# Patient Record
Sex: Female | Born: 1945 | Race: Black or African American | Marital: Single | Smoking: Never smoker
Health system: Southern US, Community
[De-identification: ages and names within clinical notes are randomized; demographics above are authoritative.]

---

## 2013-05-22 ENCOUNTER — Other Ambulatory Visit: Payer: Self-pay | Admitting: Orthopaedic Surgery

## 2013-05-22 ENCOUNTER — Ambulatory Visit
Admission: RE | Admit: 2013-05-22 | Discharge: 2013-05-22 | Disposition: A | Discharge status: Home / Self Care | Payer: Medicare Other | Source: Ambulatory Visit | Attending: Orthopaedic Surgery | Admitting: Orthopaedic Surgery

## 2013-05-22 DIAGNOSIS — M79669 Pain in unspecified lower leg: Secondary | ICD-10-CM

## 2013-05-22 DIAGNOSIS — M79662 Pain in left lower leg: Secondary | ICD-10-CM

## 2013-05-22 DIAGNOSIS — M7989 Other specified soft tissue disorders: Secondary | ICD-10-CM

## 2014-05-20 IMAGING — US US EXTREM LOW VENOUS*L*
1 series · 14 of 24 positions shown · non-contrast
Comparison: None.

CLINICAL DATA: 66-year-old female with left lower extremity pain
and swelling.

LEFT LOWER EXTREMITY VENOUS DUPLEX ULTRASOUND
TECHNIQUE: Gray-scale sonography with graded compression, as well
as color Doppler and duplex ultrasound were performed to evaluate
the deep venous system of the lower extremity from the level of the
common femoral vein through the popliteal and proximal calf veins.
Spectral Doppler was utilized to evaluate flow at rest and with
distal augmentation maneuvers.

[Series 1: us extrem low venous*left* · 14 of 38 slices shown]
[im 1/38]
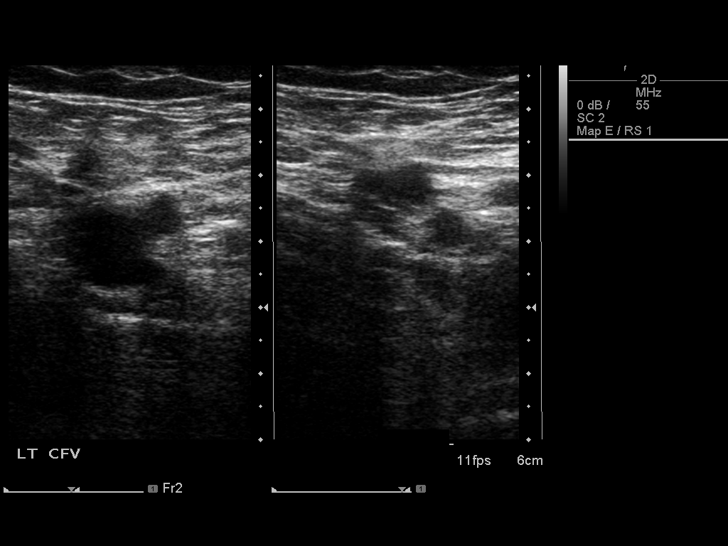
[im 4/38]
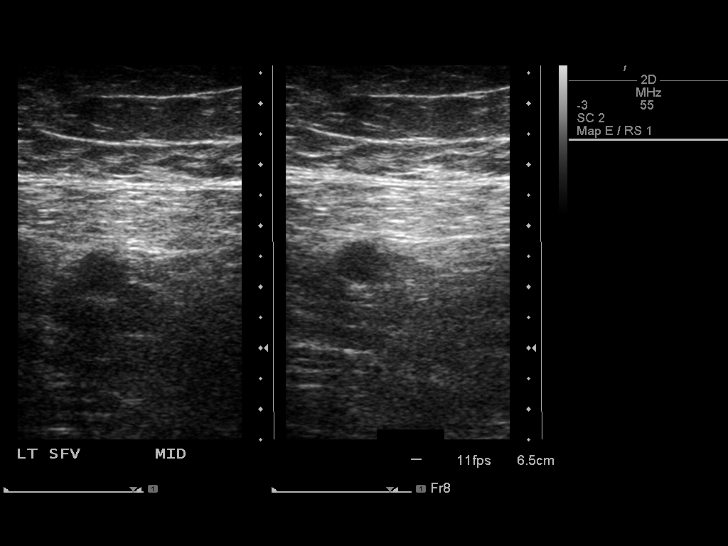
[im 7/38]
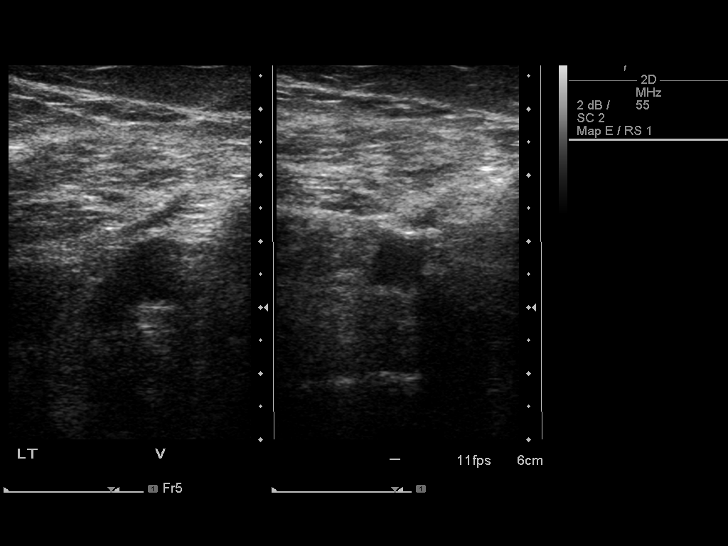
[im 10/38]
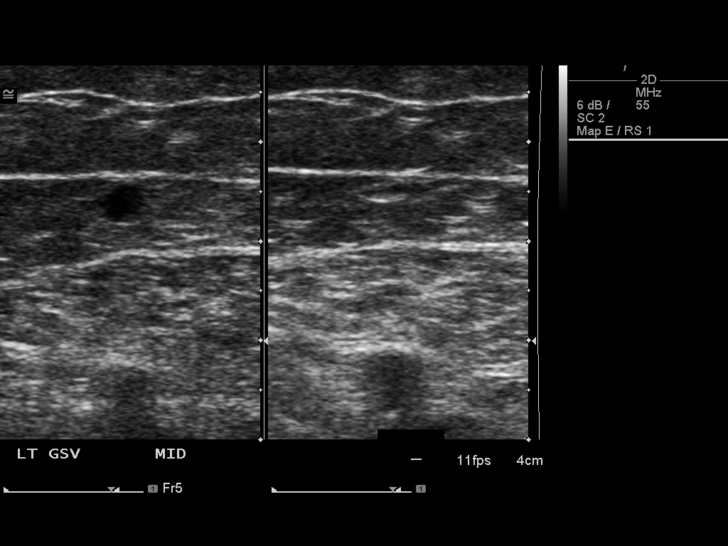
[im 12/38]
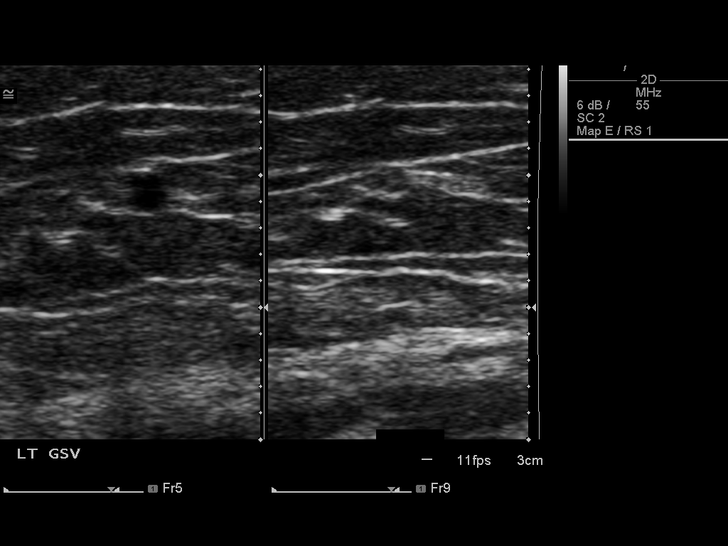
[im 15/38]
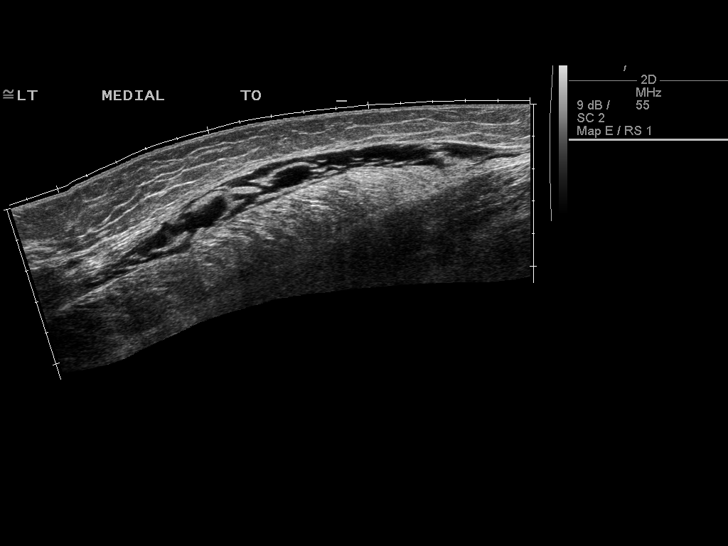
[im 18/38]
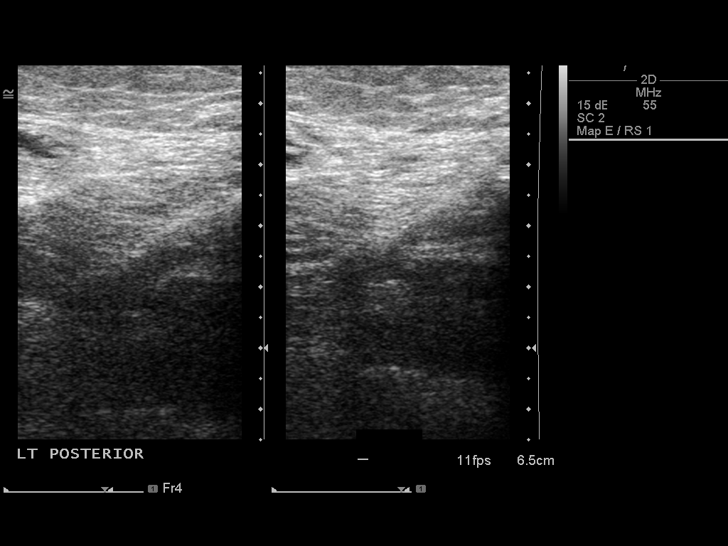
[im 20/38]
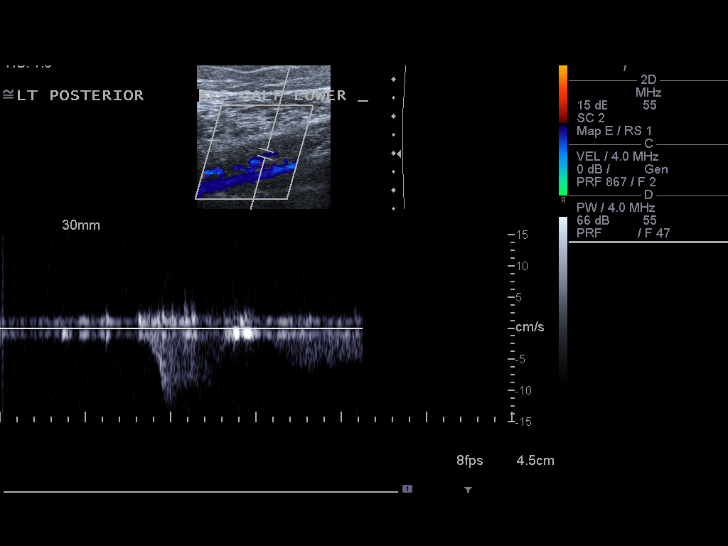
[im 23/38]
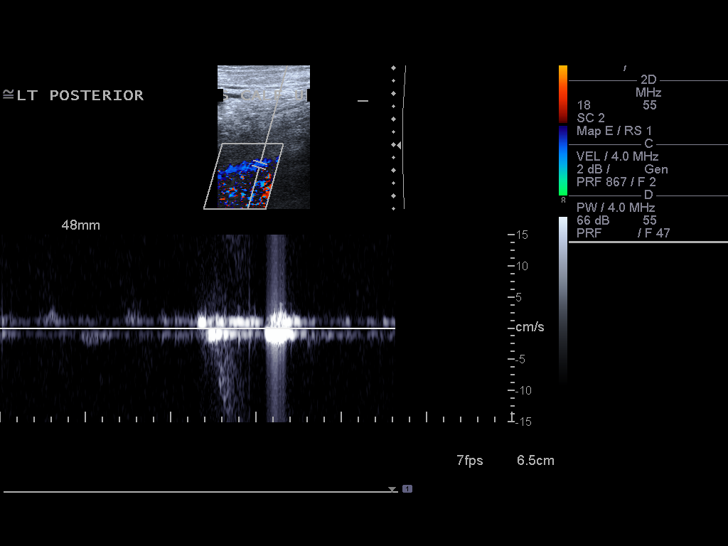
[im 26/38]
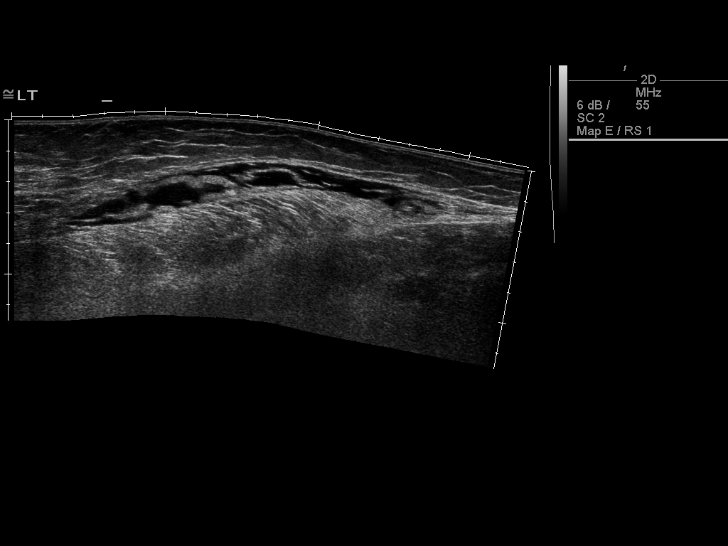
[im 29/38]
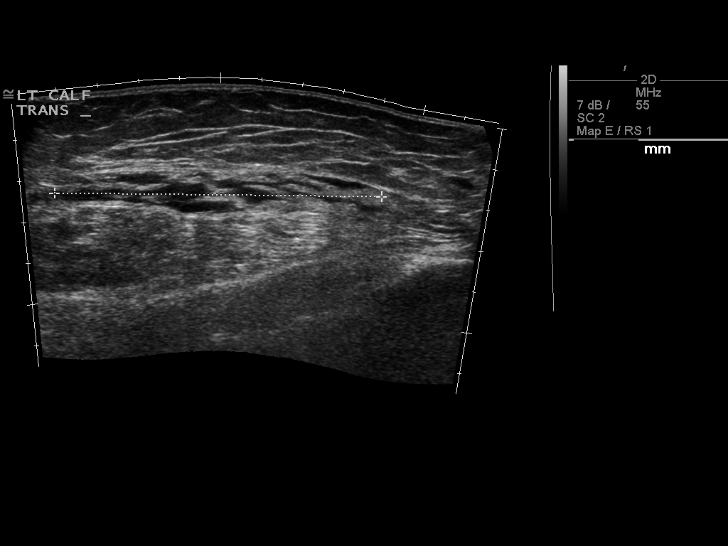
[im 31/38]
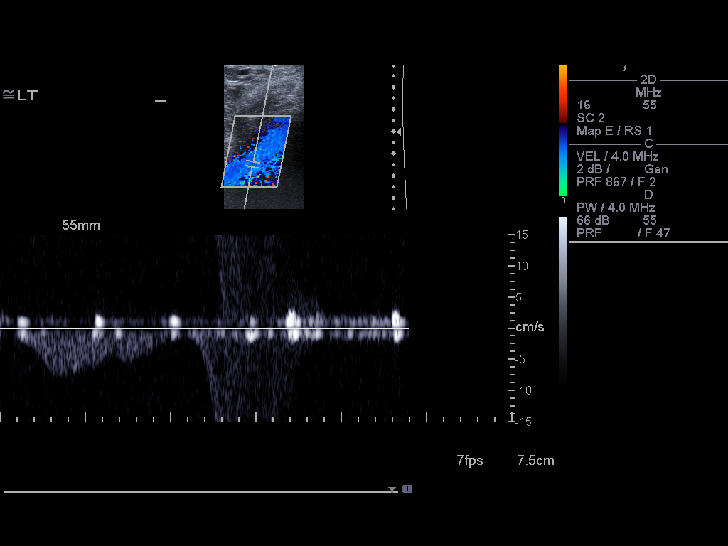
[im 34/38]
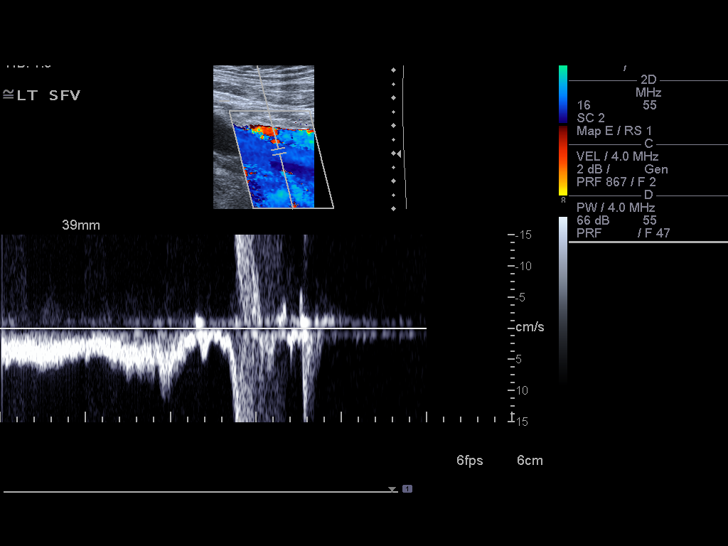
[im 38/38]
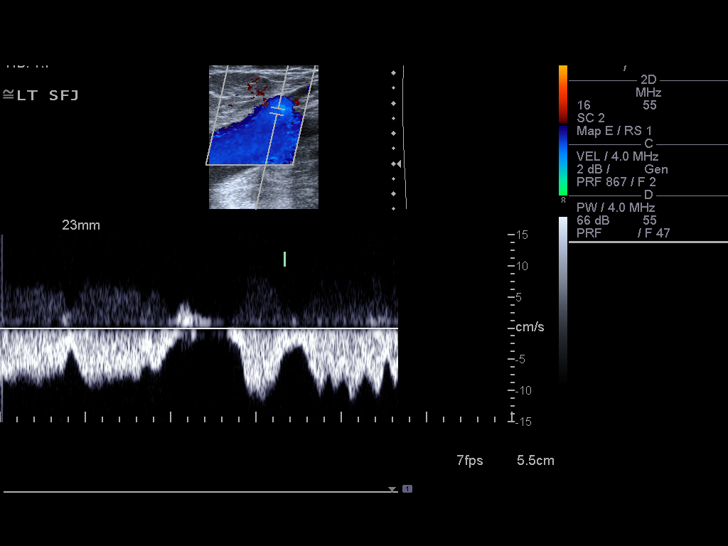

[14 of 24 positions shown; findings below may reference images not displayed]

FINDINGS: Normal compressibility of the common femoral,
superficial femoral, and popliteal veins is demonstrated, as well
as the visualized proximal calf veins.  No filling defects to
suggest DVT on grayscale or color Doppler imaging.  Doppler
waveforms show normal direction of venous flow, normal respiratory
phasicity and response to augmentation.

There is edema superficial to the calf musculature.  No discrete
collection or mass is identified.
IMPRESSION: No evidence of left lower extremity deep vein thrombosis.

Left calf edema.

## 2017-06-21 ENCOUNTER — Ambulatory Visit (INDEPENDENT_AMBULATORY_CARE_PROVIDER_SITE_OTHER): Payer: Medicare Other

## 2017-06-21 ENCOUNTER — Encounter (INDEPENDENT_AMBULATORY_CARE_PROVIDER_SITE_OTHER): Payer: Self-pay | Admitting: Orthopaedic Surgery

## 2017-06-21 ENCOUNTER — Ambulatory Visit (INDEPENDENT_AMBULATORY_CARE_PROVIDER_SITE_OTHER): Payer: Medicare Other | Admitting: Orthopaedic Surgery

## 2017-06-21 VITALS — BP 137/77 | HR 90 | Resp 14 | Ht 70.0 in | Wt 200.0 lb

## 2017-06-21 DIAGNOSIS — M5440 Lumbago with sciatica, unspecified side: Secondary | ICD-10-CM

## 2017-06-21 NOTE — Progress Notes (Signed)
Office Visit Note   Patient: Natalie Mahoney           Date of Birth: [birth year]           MRN: [id number] Visit Date: 06/21/2017              Requested by: No referring provider defined for this encounter. PCP: Patient, No Pcp Per   Assessment & Plan: Visit Diagnoses:  1. Low back pain with sciatica, sciatica laterality unspecified, unspecified back pain laterality, unspecified chronicity     Plan: Long discussion over a least 45 minutes regarding possible etiology of her pain. I believe discomfort originates in the lumbar spine. She has evidence of claudication and possibly spinal stenosis. 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 [place] She would like to try a course of physical therapy. Given her prescription for the above. If she doesn't have much improvement over the next 4-6 weeks I think it's worth obtaining an MRI scan of her lumbar spine  Follow-Up Instructions: Return if symptoms worsen or fail to improve.   Orders:  Orders Placed This Encounter  Procedures  . XR Lumbar Spine 2-3 Views  . XR Pelvis 1-2 Views   No orders of the defined types were placed in this encounter.     Procedures: No procedures performed   Clinical Data: No additional findings.   Subjective: Chief Complaint  Patient presents with  . Left Shoulder - Pain  Natalie Mahoney Presents today with complaints of low back and right lower extremity pain. There is no history of injury or trauma. She denies shortness of breath or chest pain. Pain was insidious in onset gaining several months ago. It seems to start in her lumbar spine and radiates to her right buttock and then into her right leg both thigh and calf. Symptoms are predominantly pain without numbness or tingling. She's really not having much groin discomfort. No symptoms on the left. She also was not experiencing any abdominal complaints. She  is able to sleep comfortably. The longer she stands or the further she walks she'll have some discomfort  HPI  Review of Systems  Constitutional: Negative for chills, fatigue and fever.  Eyes: Negative for itching.  Respiratory: Negative for chest tightness and shortness of breath.   Cardiovascular: Negative for chest pain, palpitations and leg swelling.  Gastrointestinal: Negative for blood in stool, constipation and diarrhea.  Endocrine: Negative for polyuria.  Genitourinary: Negative for dysuria.  Musculoskeletal: Negative for back pain, joint swelling, neck pain and neck stiffness.  Allergic/Immunologic: Negative for immunocompromised state.  Neurological: Negative for dizziness and numbness.  Hematological: Does not bruise/bleed easily.  Psychiatric/Behavioral: The patient is not nervous/anxious.      Objective: Vital Signs: BP 137/77   Pulse 90   Resp 14   Ht 5\' 10"  (1.778 m)   Wt 200 lb (90.7 kg)   BMI 28.70 kg/m   Physical Exam  Ortho Exam awake alert and oriented 3. Comfortable sitting. Nicely dressed. Straight leg raise is negative bilaterally. No obvious hypertrophy or atrophy of one leg compared to the other. No swelling distally. There are some venous stasis changes in both legs the distal third. The pulses. No knee pain. Some mild discomfort with internal/external rotation of her right hip compared to left. This is consistent with  mild arthritis on the plain films. Very minimal percussible tenderness of the lumbar spine. No  pain over the sacroiliac joints  Specialty Comments:  No specialty comments available.  Imaging: Xr Lumbar Spine 2-3 Views  Result Date: 06/21/2017 Films lumbar spine obtained in 2 projections. 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 Colonic stool obliterates much of the detail. There are facet joint changes at L34 L4-5 and L5-S1 there is an anterior listhesis grade 1 of L4 on L5. Decreased disc space height at L5-S1  without listhesis. Facet joint changes and also identified at L3-4 L4-5 and L5-S1. Mild calcification abdominal aorta without obvious aneurysmal dilatation  Xr Pelvis 1-2 Views  Result Date: 06/21/2017 AP of the pelvis demonstrates some mild arthritis of the right hip compared to left. There are some subchondral cysts in the femoral head with sclerosis. Minimal changes on the left. Bony pelvis appears to be intact    PMFS History: There are no active problems to display for this patient.  History reviewed. No pertinent past medical history.  No family history on file.  History reviewed. No pertinent surgical history. Social History   Occupational History  . Not on file.   Social History Main Topics  . Smoking status: Never Smoker  . Smokeless tobacco: Never Used  . Alcohol use Not on file  . Drug use: No  . Sexual activity: Not on file

## 2017-07-08 ENCOUNTER — Telehealth (INDEPENDENT_AMBULATORY_CARE_PROVIDER_SITE_OTHER): Payer: Self-pay | Admitting: Orthopaedic Surgery

## 2017-07-08 NOTE — Telephone Encounter (Signed)
CD at front desk, phone # in chart disconnected

## 2017-07-08 NOTE — Telephone Encounter (Signed)
Patient would like a copy of her xrays from last visit on Oct 29. On CD, and paper. Patient will pick this up tomorrow 11/16

## 2017-08-25 ENCOUNTER — Telehealth (INDEPENDENT_AMBULATORY_CARE_PROVIDER_SITE_OTHER): Payer: Self-pay | Admitting: Orthopaedic Surgery

## 2017-08-25 NOTE — Telephone Encounter (Signed)
Patient called disgruntled about receiving medical records release form with note advising her that her form that she filled out needs to be dated. I told her that indeed her signature must be dated and she got my name and verified our mailing address and said she would mail.

## 2024-02-16 ENCOUNTER — Ambulatory Visit (INDEPENDENT_AMBULATORY_CARE_PROVIDER_SITE_OTHER): Admitting: Orthopedic Surgery

## 2024-02-16 ENCOUNTER — Other Ambulatory Visit (INDEPENDENT_AMBULATORY_CARE_PROVIDER_SITE_OTHER): Payer: Self-pay

## 2024-02-16 VITALS — BP 135/70 | HR 74 | Ht 68.0 in | Wt 202.4 lb

## 2024-02-16 DIAGNOSIS — M545 Low back pain, unspecified: Secondary | ICD-10-CM | POA: Diagnosis not present

## 2024-02-16 NOTE — Progress Notes (Signed)
 Orthopedic Spine Surgery Office Note  Assessment: Patient is a 78 y.o. female with low back pain and right groin pain.  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 Discussed that the conservative treatments include:  -activity modification  -physical therapy  -over the counter pain medications  -medrol dosepak  -steroid injections -Patient has tried physical therapy in the past and found it helpful.  She is currently seeing a physical therapist, so I told her to do that with the home exercise program for 6 to 8 weeks.  If she does not notice any improvement, then would consider getting a MRI -She can use Tylenol up to 1000 mg 3 times daily -Patient should return to office on an as-needed basis   Patient expressed understanding of the plan and all questions were answered to the patient's satisfaction.   ___________________________________________________________________________   History:  Patient is a 78 y.o. female who presents today for lumbar spine.  Patient has a history of low back pain and right sided groin pain.  She has had this on and off for several years.  Her most recent episode started about 3 months ago.  She feels that it started around the time she was doing more lifting as she frequently moves around this state and [place] .   Weakness: Denies Symptoms of imbalance: Denies Paresthesias and numbness: Denies Bowel or bladder incontinence: Denies Saddle anesthesia: Denies  Treatments tried: PT in the past and reports it working well for her  Review of systems: Denies fevers and chills, night sweats, unexplained weight loss, history of cancer, pain that wakes them at night  Past medical history: Constipation  Allergies: NKDA  Past surgical history:  None  Social history: Denies use of nicotine product (smoking,  vaping, patches, smokeless) Alcohol use: Yes, approximately 2-3 drinks per week Denies recreational drug use   Physical Exam:  BMI of 30.8  General: no acute distress, appears stated age Neurologic: alert, answering questions appropriately, following commands Respiratory: unlabored breathing on room air, symmetric chest rise Psychiatric: appropriate affect, normal cadence to speech   MSK (spine):  -Strength exam      Left  Right EHL    5/5  5/5 TA    5/5  5/5 GSC    5/5  5/5 Knee extension  5/5  5/5 Hip flexion   5/5  5/5  -Sensory exam    Sensation intact to light touch in L3-S1 nerve distributions of bilateral lower extremities  -Achilles DTR: 1/4 on the left, 1/4 on the right -Patellar tendon DTR: 1/4 on the left, 1/4 on the right  -Straight leg raise: Negative bilaterally -Clonus: no beats bilaterally  -Left hip exam: No pain through range of motion -Right hip exam: Positive FADIR, no pain through remainder of range of motion, negative Stinchfield, negative FABER  Imaging: XRs of the lumbar spine from 02/16/2024 were independently reviewed and interpreted, showing spondylolisthesis at L4/5.  Disc height loss at L4/5 and L5/S1.  Facet arthropathy at L4/5 and L5/S1.  No fracture or dislocation seen.   Patient name: Natalie Mahoney Patient MRN: [id number] Date of visit: 02/16/24

## 2024-04-12 ENCOUNTER — Telehealth: Payer: Self-pay | Admitting: Orthopedic Surgery

## 2024-04-12 ENCOUNTER — Other Ambulatory Visit: Payer: Self-pay | Admitting: Radiology

## 2024-04-12 DIAGNOSIS — M545 Low back pain, unspecified: Secondary | ICD-10-CM

## 2024-04-12 NOTE — Telephone Encounter (Signed)
 Cindy from [facility] called. Does Dr. Georgina want patient to have PT on her back? She was seen there today without a referral. If Dr. Georgina would fax a referral to [phone] for PT

## 2024-04-12 NOTE — Telephone Encounter (Signed)
 Order placed and faxed

## 2024-04-18 ENCOUNTER — Telehealth: Payer: Self-pay | Admitting: Orthopedic Surgery

## 2024-04-18 ENCOUNTER — Encounter: Payer: Self-pay | Admitting: Physical Medicine and Rehabilitation

## 2024-04-18 ENCOUNTER — Encounter: Admitting: Physical Medicine and Rehabilitation

## 2024-04-18 ENCOUNTER — Other Ambulatory Visit: Payer: Self-pay | Admitting: Radiology

## 2024-04-18 ENCOUNTER — Ambulatory Visit (INDEPENDENT_AMBULATORY_CARE_PROVIDER_SITE_OTHER): Admitting: Physical Medicine and Rehabilitation

## 2024-04-18 DIAGNOSIS — G8929 Other chronic pain: Secondary | ICD-10-CM

## 2024-04-18 DIAGNOSIS — M4316 Spondylolisthesis, lumbar region: Secondary | ICD-10-CM

## 2024-04-18 DIAGNOSIS — M5416 Radiculopathy, lumbar region: Secondary | ICD-10-CM

## 2024-04-18 DIAGNOSIS — M25551 Pain in right hip: Secondary | ICD-10-CM

## 2024-04-18 DIAGNOSIS — M5441 Lumbago with sciatica, right side: Secondary | ICD-10-CM | POA: Diagnosis not present

## 2024-04-18 NOTE — Progress Notes (Unsigned)
 Natalie Mahoney - 78 y.o. female MRN [id number]  Date of birth: 04-30-1946  Office Visit Note: Visit Date: 04/18/2024 PCP: Patient, No Pcp Per Referred by: No ref. provider found  Subjective: Chief Complaint  Patient presents with   Lower Back - Pain   HPI: Natalie Mahoney is a 78 y.o. female who comes in today for evaluation of chronic, worsening and severe bilateral lower back pain radiating to right groin and down leg. She is patient of Dr. Ozell Ada. Pain ongoing for several years, worsened with movement and activity. She describes her pain as sore and aching sensation, currently rates as 5 out of 10. Some relief of pain with home exercise regimen, rest and use of medications. She reports good significant relief of pain with formal physical therapy. She is requesting a paper order for continued therapy for her back and right leg. Recent lumbar radiographs shows spondylolisthesis at L4-L5. There is facet arthropathy at L4-L5 and L5-S1. She also reports chronic swelling and numbness to bilateral feet. She was recently evaluated by Dr. Jackquline Chihuahua with [facility]. Patient denies focal weakness. No recent trauma or falls.      Review of Systems  Musculoskeletal:  Positive for back pain and myalgias.  Neurological:  Negative for tingling, sensory change, focal weakness and weakness.  All other systems reviewed and are negative.  Otherwise per HPI.  Assessment & Plan: Visit Diagnoses:    ICD-10-CM   1. Chronic right-sided low back pain with right-sided sciatica  M54.41    G89.29     2. Lumbar radiculopathy  M54.16     3. Spondylolisthesis of lumbar region  M43.16        Plan: Findings:  Chronic, worsening and severe bilateral lower back pain radiating to right groin and down leg. Patient continues to have severe pain despite good conservative therapies such as formal physical therapy, home exercise regimen, rest and use of medications.  Patients clinical presentation and exam are consistent with lumbar radiculopathy. Her pain is now radiating down entire right leg, more non dermatomal. I discussed treatment plan in detail today. I provided her with paper order for continued physical therapy for lower back/right leg. She can take this order to facility of her choice. I also recommended we place order for lumbar MRI imaging given her history of chronic pain that is now radiating down right leg. She would like to hold on lumbar MRI imaging at this time. I instructed her to follow up with Dr. Ada as scheduled. No red flag symptoms noted upon exam today.     Meds & Orders: No orders of the defined types were placed in this encounter.  No orders of the defined types were placed in this encounter.   Follow-up: Return if symptoms worsen or fail to improve.   Procedures: No procedures performed      Clinical History: XRs of the lumbar spine from 02/16/2024 were independently reviewed and  interpreted, showing spondylolisthesis at L4/5.  Disc height loss at L4/5  and L5/S1.  Facet arthropathy at L4/5 and L5/S1.  No fracture or  dislocation seen.   She reports that she has never smoked. She has never used smokeless tobacco. No results for input(s): HGBA1C, LABURIC in the last 8760 hours.  Objective:  VS:  HT:    WT:   BMI:     BP:   HR: bpm  TEMP: ( )  RESP:  Physical Exam Vitals and nursing note  reviewed.  HENT:     Head: Normocephalic and atraumatic.     Right Ear: External ear normal.     Left Ear: External ear normal.     Nose: Nose normal.     Mouth/Throat:     Mouth: Mucous membranes are moist.  Eyes:     Extraocular Movements: Extraocular movements intact.  Cardiovascular:     Rate and Rhythm: Normal rate.     Pulses: Normal pulses.  Pulmonary:     Effort: Pulmonary effort is normal.  Abdominal:     General: Abdomen is flat. There is no distension.  Musculoskeletal:        General: Tenderness present.      Cervical back: Normal range of motion.     Comments: Patient is slow to rise from seated position to standing. Good lumbar range of motion. No pain noted with facet loading. 5/5 strength noted with bilateral hip flexion, knee flexion/extension, ankle dorsiflexion/plantarflexion and EHL. No clonus noted bilaterally. No pain upon palpation of greater trochanters. No pain with internal/external rotation of bilateral hips. Sensation intact bilaterally. Negative slump test bilaterally. Ambulates without aid, gait steady.     Skin:    General: Skin is warm and dry.     Capillary Refill: Capillary refill takes less than 2 seconds.  Neurological:     General: No focal deficit present.     Mental Status: She is alert and oriented to person, place, and time.  Psychiatric:        Mood and Affect: Mood normal.        Behavior: Behavior normal.     Ortho Exam  Imaging: No results found.  Past Medical/Family/Surgical/Social History: Medications & Allergies reviewed per EMR, new medications updated. There are no active problems to display for this patient.  History reviewed. No pertinent past medical history. History reviewed. No pertinent family history. History reviewed. No pertinent surgical history. Social History   Occupational History   Not on file  Tobacco Use   Smoking status: Never   Smokeless tobacco: Never  Substance and Sexual Activity   Alcohol use: Not on file   Drug use: No   Sexual activity: Not on file

## 2024-04-18 NOTE — Progress Notes (Unsigned)
 Pain Scale   Average Pain 5 Patient advising she has lower back pain radiating to right leg and foot. Patient advising she want to have more PT ordered.        +Driver, -BT, -Dye Allergies.

## 2024-04-18 NOTE — Telephone Encounter (Signed)
 Jana from [facility] called and said that the patient needs a referral because of the insurance and the date needs to be updated. She is complaining about Right hip pain and her notes say low back. CB#[phone]

## 2024-04-18 NOTE — Telephone Encounter (Signed)
 New order placed.
